# Patient Record
Sex: Male | Born: 1985 | Race: Black or African American | Hispanic: No | Marital: Single | Smoking: Current every day smoker
Health system: Southern US, Community
[De-identification: ages and names within clinical notes are randomized; demographics above are authoritative.]

---

## 2007-08-24 ENCOUNTER — Emergency Department: Payer: Self-pay | Admitting: Emergency Medicine

## 2013-07-05 ENCOUNTER — Emergency Department: Payer: Self-pay | Admitting: Emergency Medicine

## 2013-07-05 LAB — BASIC METABOLIC PANEL
Anion Gap: 8 (ref 7–16)
BUN: 13 mg/dL (ref 7–18)
Co2: 26 mmol/L (ref 21–32)
Creatinine: 0.94 mg/dL (ref 0.60–1.30)
EGFR (African American): >60
EGFR (Non-African Amer.): >60
Glucose: 87 mg/dL (ref 65–99)
Osmolality: 279 (ref 275–301)
Sodium: 140 mmol/L (ref 136–145)

## 2013-07-05 LAB — CBC
HCT: 41.7 % (ref 40.0–52.0)
HGB: 13.6 g/dL (ref 13.0–18.0)
MCHC: 32.7 g/dL (ref 32.0–36.0)
MCV: 85 fL (ref 80–100)
RBC: 4.91 10*6/uL (ref 4.40–5.90)
RDW: 12.4 % (ref 11.5–14.5)
WBC: 6.3 10*3/uL (ref 3.8–10.6)

## 2013-07-05 LAB — TROPONIN I: Troponin-I: <0.02 ng/mL

## 2013-09-30 ENCOUNTER — Emergency Department: Payer: Self-pay | Admitting: Emergency Medicine

## 2015-07-08 ENCOUNTER — Emergency Department
Admission: EM | Admit: 2015-07-08 | Discharge: 2015-07-08 | Disposition: A | Discharge status: Home / Self Care | Payer: Self-pay | Attending: Emergency Medicine | Admitting: Emergency Medicine

## 2015-07-08 ENCOUNTER — Encounter: Payer: Self-pay | Admitting: Emergency Medicine

## 2015-07-08 DIAGNOSIS — Z720 Tobacco use: Secondary | ICD-10-CM | POA: Insufficient documentation

## 2015-07-08 DIAGNOSIS — K029 Dental caries, unspecified: Secondary | ICD-10-CM | POA: Insufficient documentation

## 2015-07-08 MED ORDER — LIDOCAINE VISCOUS 2 % MT SOLN
15.0000 mL | Freq: Once | OROMUCOSAL | Status: AC
Start: 2015-07-08 — End: 2015-07-08
  Administered 2015-07-08: 15 mL via OROMUCOSAL
  Filled 2015-07-08: qty 15

## 2015-07-08 MED ORDER — AMOXICILLIN 500 MG PO CAPS
500.0000 mg | ORAL_CAPSULE | Freq: Once | ORAL | Status: AC
  Administered 2015-07-08: 500 mg via ORAL
  Filled 2015-07-08: qty 1

## 2015-07-08 MED ORDER — KETOROLAC TROMETHAMINE 10 MG PO TABS: 10.0000 mg | ORAL_TABLET | Freq: Three times a day (TID) | ORAL | Status: AC | PRN

## 2015-07-08 MED ORDER — AMOXICILLIN 500 MG PO CAPS: 500.0000 mg | ORAL_CAPSULE | Freq: Two times a day (BID) | ORAL | Status: AC

## 2015-07-08 MED ORDER — KETOROLAC TROMETHAMINE 10 MG PO TABS
10.0000 mg | ORAL_TABLET | Freq: Once | ORAL | Status: AC
  Administered 2015-07-08: 10 mg via ORAL
  Filled 2015-07-08: qty 1

## 2015-07-08 NOTE — Discharge Instructions (Signed)
Dental Caries  Dental caries (also called tooth decay) is the most common oral disease. It can occur at any age but is more common in children and young adults.   HOW DENTAL CARIES DEVELOPS   The process of decay begins when bacteria and foods (particularly sugars and starches) combine in your mouth to produce plaque. Plaque is a substance that sticks to the hard, outer surface of a tooth (enamel). The bacteria in plaque produce acids that attack enamel. These acids may also attack the root surface of a tooth (cementum) if it is exposed. Repeated attacks dissolve these surfaces and create holes in the tooth (cavities). If left untreated, the acids destroy the other layers of the tooth.   RISK FACTORS   Frequent sipping of sugary beverages.    Frequent snacking on sugary and starchy foods, especially those that easily get stuck in the teeth.    Poor oral hygiene.    Dry mouth.    Substance abuse such as methamphetamine abuse.    Broken or poor-fitting dental restorations.    Eating disorders.    Gastroesophageal reflux disease (GERD).    Certain radiation treatments to the head and neck.  SYMPTOMS  In the early stages of dental caries, symptoms are seldom present. Sometimes white, chalky areas may be seen on the enamel or other tooth layers. In later stages, symptoms may include:   Pits and holes on the enamel.   Toothache after sweet, hot, or cold foods or drinks are consumed.   Pain around the tooth.   Swelling around the tooth.  DIAGNOSIS   Most of the time, dental caries is detected during a regular dental checkup. A diagnosis is made after a thorough medical and dental history is taken and the surfaces of your teeth are checked for signs of dental caries. Sometimes special instruments, such as lasers, are used to check for dental caries. Dental X-ray exams may be taken so that areas not visible to the eye (such as between the contact areas of the teeth) can be checked for cavities.    TREATMENT   If dental caries is in its early stages, it may be reversed with a fluoride treatment or an application of a remineralizing agent at the dental office. Thorough brushing and flossing at home is needed to aid these treatments. If it is in its later stages, treatment depends on the location and extent of tooth destruction:    If a small area of the tooth has been destroyed, the destroyed area will be removed and cavities will be filled with a material such as gold, silver amalgam, or composite resin.    If a large area of the tooth has been destroyed, the destroyed area will be removed and a cap (crown) will be fitted over the remaining tooth structure.    If the center part of the tooth (pulp) is affected, a procedure called a root canal will be needed before a filling or crown can be placed.    If most of the tooth has been destroyed, the tooth may need to be pulled (extracted).  HOME CARE INSTRUCTIONS  You can prevent, stop, or reverse dental caries at home by practicing good oral hygiene. Good oral hygiene includes:   Thoroughly cleaning your teeth at least twice a day with a toothbrush and dental floss.    Using a fluoride toothpaste. A fluoride mouth rinse may also be used if recommended by your dentist or health care provider.    Restricting   the amount of sugary and starchy foods and sugary liquids you consume.    Avoiding frequent snacking on these foods and sipping of these liquids.    Keeping regular visits with a dentist for checkups and cleanings.  PREVENTION    Practice good oral hygiene.   Consider a dental sealant. A dental sealant is a coating material that is applied by your dentist to the pits and grooves of teeth. The sealant prevents food from being trapped in them. It may protect the teeth for several years.   Ask about fluoride supplements if you live in a community without fluorinated water or with water that has a low fluoride content. Use fluoride supplements  as directed by your dentist or health care provider.   Allow fluoride varnish applications to teeth if directed by your dentist or health care provider.  Document Released: 08/20/2002 Document Revised: 04/14/2014 Document Reviewed: 11/30/2012  [employer] Patient Information 2015 [employer]. This information is not intended to replace advice given to you by your health care provider. Make sure you discuss any questions you have with your health care provider.

## 2015-07-08 NOTE — ED Notes (Signed)
Pt sleeping soundly in lobby despite being called several times with no answer

## 2015-07-08 NOTE — ED Notes (Signed)
Patient ambulatory to triage with steady gait, without difficulty or distress noted; pt reports right sided dental pain--st broke tooth 9months ago and now having increased pain

## 2015-07-08 NOTE — ED Provider Notes (Signed)
[facility] Emergency Department Provider Note  ____________________________________________  Time seen: 2:30 AM  I have reviewed the triage vital signs and the nursing notes.   HISTORY  Chief Complaint Dental Pain     HPI Christian Hayes is a 29 y.o. male presents with right maxillary molar pain 9 months with acute worsening the past 2 days. Patient denies any fever no difficulty swallowing no neck or throat pain. Patient asleep on my presentation to room. Patient current pain score 9 out of 10.     Past medical history None  There are no active problems to display for this patient.   Past surgical history  None  No current outpatient prescriptions on file.  Allergies No known drug allergies  No family history on file.  Social History History  Substance Use Topics  . Smoking status: Current Every Day Smoker -- 1.00 packs/day    Types: Cigarettes  . Smokeless tobacco: Not on file  . Alcohol Use: No    Review of Systems  Constitutional: Negative for fever. Eyes: Negative for visual changes. ENT: Negative for sore throat.positive for dental pain Cardiovascular: Negative for chest pain. Respiratory: Negative for shortness of breath. Gastrointestinal: Negative for abdominal pain, vomiting and diarrhea. Genitourinary: Negative for dysuria. Musculoskeletal: Negative for back pain. Skin: Negative for rash. Neurological: Negative for headaches, focal weakness or numbness.  10-point ROS otherwise negative.  ____________________________________________   PHYSICAL EXAM:  VITAL SIGNS: ED Triage Vitals  Enc Vitals Group     BP 07/08/15 0150 118/59 mmHg     Pulse Rate 07/08/15 0150 78     Resp 07/08/15 0150 20     Temp 07/08/15 0150 97.6 F (36.4 C)     Temp src --      SpO2 07/08/15 0150 98 %     Weight 07/08/15 0150 135 lb (61.236 kg)     Height 07/08/15 0150  (1.727 m)     Head Cir --      Peak Flow --      Pain Score  07/08/15 0151 9     Pain Loc --      Pain Edu? --    Constitutional: Alert and oriented. Well appearing and in no distress. Eyes: Conjunctivae are normal. PERRL. Normal extraocular movements. ENT   Head: Normocephalic and atraumatic.   Nose: No congestion/rhinnorhea.   Mouth/Throat: Mucous membranes are moist.Small dental carry noted right maxillary molar.  Neck: No stridor. Hematological/Lymphatic/Immunilogical: No cervical lymphadenopathy.    INITIAL IMPRESSION / ASSESSMENT AND PLAN / ED COURSE  Pertinent labs & imaging results that were available during my care of the patient were reviewed by me and considered in my medical decision making (see chart for details).  Patient received amoxicillin 500 mg Toradol 10 mg by mouth and viscous lidocaine swish and spit.  ____________________________________________   FINAL CLINICAL IMPRESSION(S) / ED DIAGNOSES  Final diagnoses:  Dental caries      Darci Current, MD 07/08/15 [phone]

## 2015-10-12 ENCOUNTER — Emergency Department
Admission: EM | Admit: 2015-10-12 | Discharge: 2015-10-12 | Disposition: A | Discharge status: Home / Self Care | Payer: Self-pay | Attending: Emergency Medicine | Admitting: Emergency Medicine

## 2015-10-12 ENCOUNTER — Emergency Department: Payer: Self-pay

## 2015-10-12 ENCOUNTER — Encounter: Payer: Self-pay | Admitting: Emergency Medicine

## 2015-10-12 DIAGNOSIS — Y9389 Activity, other specified: Secondary | ICD-10-CM | POA: Insufficient documentation

## 2015-10-12 DIAGNOSIS — S79911A Unspecified injury of right hip, initial encounter: Secondary | ICD-10-CM | POA: Insufficient documentation

## 2015-10-12 DIAGNOSIS — T1490XA Injury, unspecified, initial encounter: Secondary | ICD-10-CM

## 2015-10-12 DIAGNOSIS — Y990 Civilian activity done for income or pay: Secondary | ICD-10-CM | POA: Insufficient documentation

## 2015-10-12 DIAGNOSIS — M5410 Radiculopathy, site unspecified: Secondary | ICD-10-CM | POA: Insufficient documentation

## 2015-10-12 DIAGNOSIS — Z720 Tobacco use: Secondary | ICD-10-CM | POA: Insufficient documentation

## 2015-10-12 DIAGNOSIS — R52 Pain, unspecified: Secondary | ICD-10-CM

## 2015-10-12 DIAGNOSIS — Y9289 Other specified places as the place of occurrence of the external cause: Secondary | ICD-10-CM | POA: Insufficient documentation

## 2015-10-12 MED ORDER — MELOXICAM 15 MG PO TABS: 15.0000 mg | ORAL_TABLET | Freq: Every day | ORAL | Status: AC
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
## 2015-10-12 NOTE — Discharge Instructions (Signed)
Heat Therapy Heat therapy can help ease sore, stiff, injured, and tight muscles and joints. Heat relaxes your muscles, which may help ease your pain.  RISKS AND COMPLICATIONS If you have any of the following conditions, do not use heat therapy unless your health care provider has approved:  Poor circulation.  Healing wounds or scarred skin in the area being treated.  Diabetes, heart disease, or high blood pressure.  Not being able to feel (numbness) the area being treated.  Unusual swelling of the area being treated.  Active infections.  Blood clots.  Cancer.  Inability to communicate pain. This may include young children and people who have problems with their brain function (dementia).  Pregnancy. Heat therapy should only be used on old, pre-existing, or long-lasting (chronic) injuries. Do not use heat therapy on new injuries unless directed by your health care provider. HOW TO USE HEAT THERAPY There are several different kinds of heat therapy, including:  Moist heat pack.  Warm water bath.  Hot water bottle.  Electric heating pad.  Heated gel pack.  Heated wrap.  Electric heating pad. Use the heat therapy method suggested by your health care provider. Follow your health care provider's instructions on when and how to use heat therapy. GENERAL HEAT THERAPY RECOMMENDATIONS  Do not sleep while using heat therapy. Only use heat therapy while you are awake.  Your skin may turn pink while using heat therapy. Do not use heat therapy if your skin turns red.  Do not use heat therapy if you have new pain.  High heat or long exposure to heat can cause burns. Be careful when using heat therapy to avoid burning your skin.  Do not use heat therapy on areas of your skin that are already irritated, such as with a rash or sunburn. SEEK MEDICAL CARE IF:  You have blisters, redness, swelling, or numbness.  You have new pain.  Your pain is worse. MAKE SURE  YOU:  Understand these instructions.  Will watch your condition.  Will get help right away if you are not doing well or get worse.   This information is not intended to replace advice given to you by your health care provider. Make sure you discuss any questions you have with your health care provider.   Document Released: 02/20/2012 Document Revised: 12/19/2014 Document Reviewed: 01/21/2014 Elsevier Interactive Patient Education 2016 [employer]  Radicular Pain Radicular pain in either the arm or leg is usually from a bulging or herniated disk in the spine. A piece of the herniated disk may press against the nerves as the nerves exit the spine. This causes pain which is felt at the tips of the nerves down the arm or leg. Other causes of radicular pain may include:  Fractures.  Heart disease.  Cancer.  An abnormal and usually degenerative state of the nervous system or nerves (neuropathy). Diagnosis may require CT or MRI scanning to determine the primary cause.  Nerves that start at the neck (nerve roots) may cause radicular pain in the outer shoulder and arm. It can spread down to the thumb and fingers. The symptoms vary depending on which nerve root has been affected. In most cases radicular pain improves with conservative treatment. Neck problems may require physical therapy, a neck collar, or cervical traction. Treatment may take many weeks, and surgery may be considered if the symptoms do not improve.  Conservative treatment is also recommended for sciatica. Sciatica causes pain to radiate from the lower back or buttock area  down the leg into the foot. Often there is a history of back problems. Most patients with sciatica are better after 2 to 4 weeks of rest and other supportive care. Short term bed rest can reduce the disk pressure considerably. Sitting, however, is not a good position since this increases the pressure on the disk. You should avoid bending, lifting, and all other  activities which make the problem worse. Traction can be used in severe cases. Surgery is usually reserved for patients who do not improve within the first months of treatment. Only take over-the-counter or prescription medicines for pain, discomfort, or fever as directed by your caregiver. Narcotics and muscle relaxants may help by relieving more severe pain and spasm and by providing mild sedation. Cold or massage can give significant relief. Spinal manipulation is not recommended. It can increase the degree of disc protrusion. Epidural steroid injections are often effective treatment for radicular pain. These injections deliver medicine to the spinal nerve in the space between the protective covering of the spinal cord and back bones (vertebrae). Your caregiver can give you more information about steroid injections. These injections are most effective when given within two weeks of the onset of pain.  You should see your caregiver for follow up care as recommended. A program for neck and back injury rehabilitation with stretching and strengthening exercises is an important part of management.  SEEK IMMEDIATE MEDICAL CARE IF:  You develop increased pain, weakness, or numbness in your arm or leg.  You develop difficulty with bladder or bowel control.  You develop abdominal pain.   This information is not intended to replace advice given to you by your health care provider. Make sure you discuss any questions you have with your health care provider.   Document Released: 01/05/2005 Document Revised: 12/19/2014 Document Reviewed: 06/24/2015 Elsevier Interactive Patient Education [employer]

## 2016-05-25 IMAGING — CR DG LUMBAR SPINE COMPLETE 4+V
1 series · 5 of 5 positions shown · non-contrast
Comparison: None.

CLINICAL DATA: Right groin pain

EXAM:
LUMBAR SPINE - COMPLETE 4+ VIEW

[Series 1: dg lumbar spine complete 4 +v · 0.14mm/px · 5 of 5 slices shown]
[im 1/5]
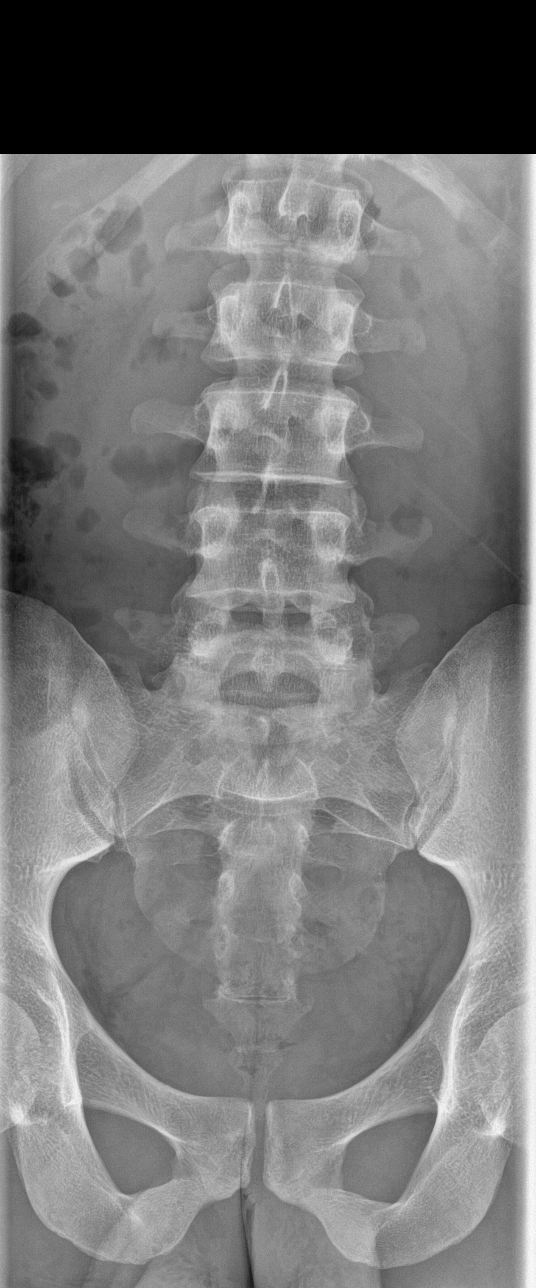
[im 2/5]
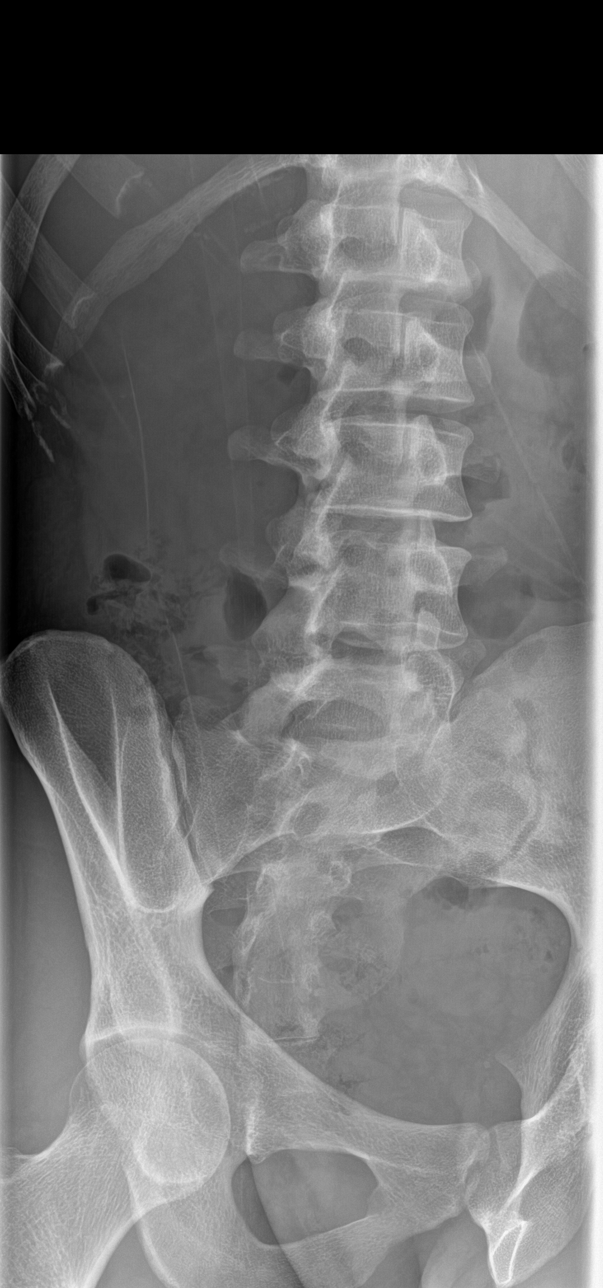
[im 3/5]
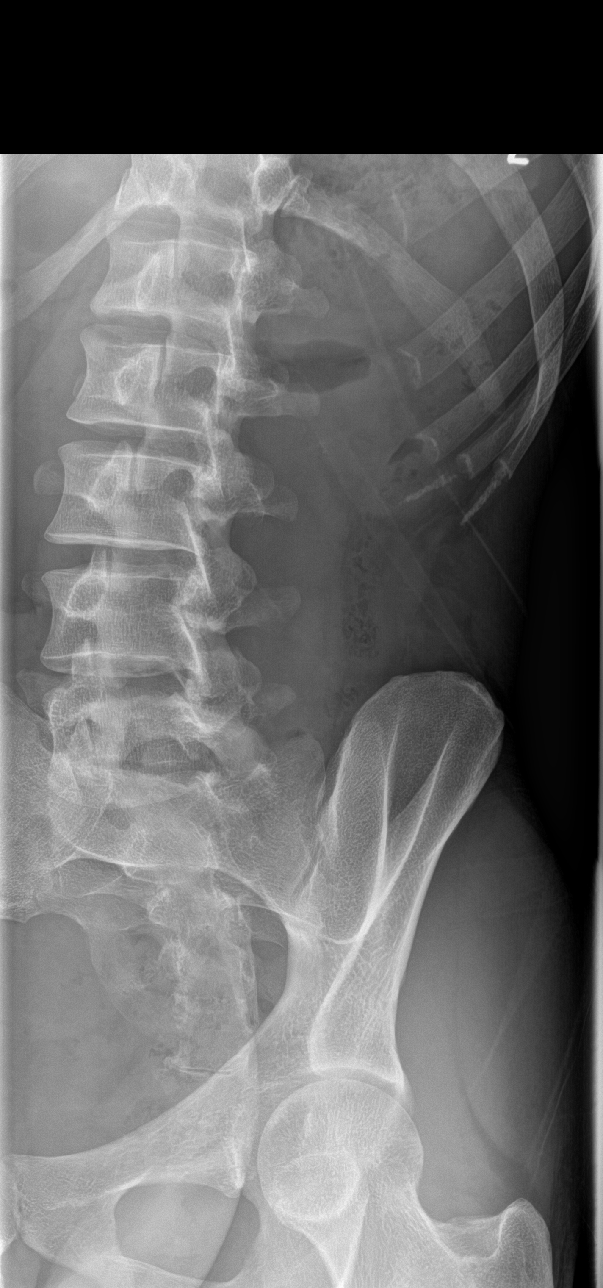
[im 4/5]
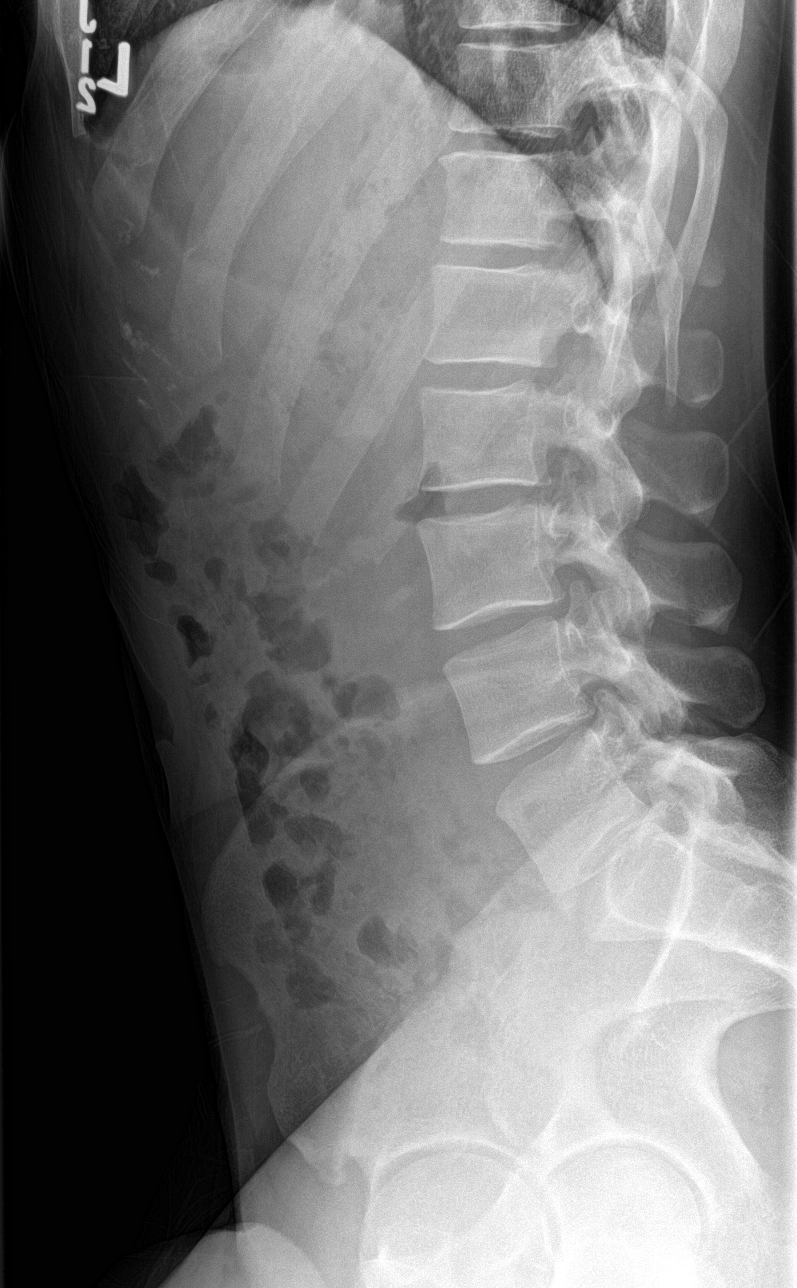
[im 5/5]
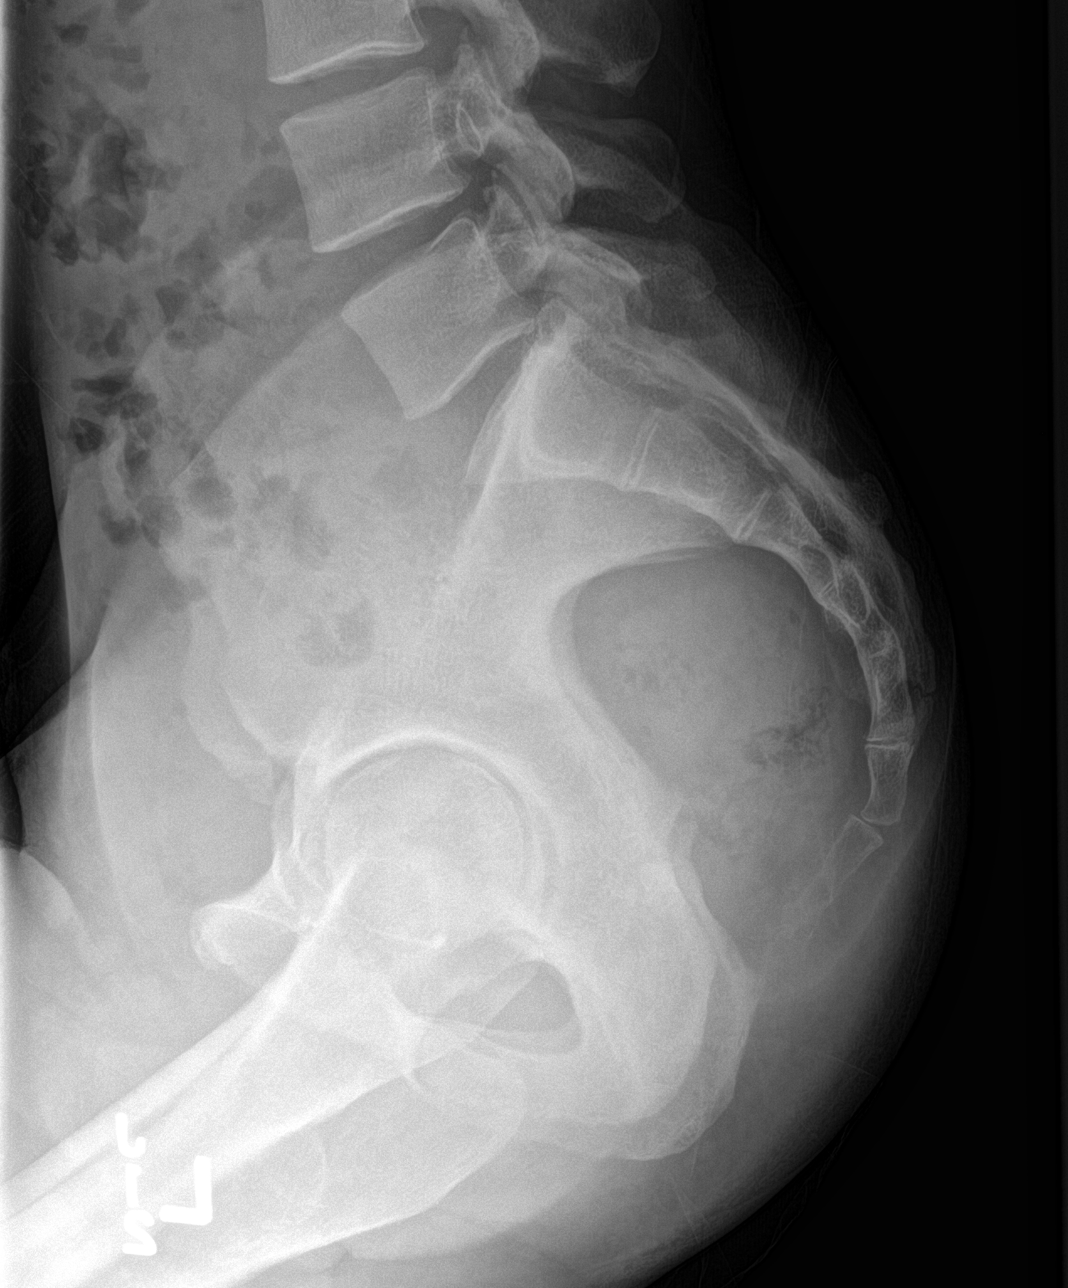

[5 of 5 positions shown; findings below may reference images not displayed]

FINDINGS: Five views of the lumbar spine submitted. No acute fracture or
subluxation. Alignment, disc spaces and vertebral body heights are
preserved.
IMPRESSION: Negative.
